# Patient Record
Sex: Male | Born: 1945 | Race: White | Hispanic: No | Smoking: Current every day smoker
Health system: Southern US, Community
[De-identification: ages and names within clinical notes are randomized; demographics above are authoritative.]

## PROBLEM LIST (undated history)

## (undated) HISTORY — PX: HIP SURGERY: SHX245

## (undated) HISTORY — PX: JOINT REPLACEMENT: SHX530

## (undated) HISTORY — PX: TONSILLECTOMY AND ADENOIDECTOMY: SHX28

## (undated) HISTORY — PX: HERNIA REPAIR: SHX51

## (undated) HISTORY — PX: CATARACT EXTRACTION, BILATERAL: SHX1313

---

## 2003-02-26 DIAGNOSIS — M7122 Synovial cyst of popliteal space [Baker], left knee: Secondary | ICD-10-CM | POA: Insufficient documentation

## 2004-10-12 ENCOUNTER — Emergency Department (HOSPITAL_COMMUNITY): Admission: EM | Admit: 2004-10-12 | Discharge: 2004-10-12 | Payer: Self-pay | Admitting: Emergency Medicine

## 2004-11-06 ENCOUNTER — Ambulatory Visit (HOSPITAL_COMMUNITY): Admission: RE | Admit: 2004-11-06 | Discharge: 2004-11-06 | Payer: Self-pay | Admitting: Neurosurgery

## 2005-06-24 ENCOUNTER — Ambulatory Visit (HOSPITAL_COMMUNITY): Admission: RE | Admit: 2005-06-24 | Discharge: 2005-06-24 | Payer: Self-pay | Admitting: Neurosurgery
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
## 2010-02-25 DIAGNOSIS — S43004S Unspecified dislocation of right shoulder joint, sequela: Secondary | ICD-10-CM | POA: Insufficient documentation

## 2010-03-18 ENCOUNTER — Encounter: Payer: Self-pay | Admitting: Neurosurgery

## 2011-02-26 DIAGNOSIS — S2239XA Fracture of one rib, unspecified side, initial encounter for closed fracture: Secondary | ICD-10-CM | POA: Insufficient documentation

## 2017-10-08 DIAGNOSIS — Z720 Tobacco use: Secondary | ICD-10-CM | POA: Insufficient documentation

## 2017-10-08 DIAGNOSIS — J410 Simple chronic bronchitis: Secondary | ICD-10-CM | POA: Insufficient documentation

## 2017-10-08 DIAGNOSIS — E559 Vitamin D deficiency, unspecified: Secondary | ICD-10-CM | POA: Insufficient documentation

## 2018-12-21 ENCOUNTER — Other Ambulatory Visit: Payer: Self-pay

## 2018-12-21 ENCOUNTER — Encounter: Payer: Self-pay | Admitting: Orthopedic Surgery

## 2018-12-21 ENCOUNTER — Ambulatory Visit (INDEPENDENT_AMBULATORY_CARE_PROVIDER_SITE_OTHER): Payer: Medicare Other | Admitting: Orthopedic Surgery

## 2018-12-21 VITALS — BP 138/82 | HR 63 | Ht 71.0 in | Wt 224.0 lb

## 2018-12-21 DIAGNOSIS — G8929 Other chronic pain: Secondary | ICD-10-CM

## 2018-12-21 DIAGNOSIS — M1711 Unilateral primary osteoarthritis, right knee: Secondary | ICD-10-CM

## 2018-12-21 DIAGNOSIS — G4733 Obstructive sleep apnea (adult) (pediatric): Secondary | ICD-10-CM | POA: Insufficient documentation

## 2018-12-21 DIAGNOSIS — M25561 Pain in right knee: Secondary | ICD-10-CM

## 2018-12-21 DIAGNOSIS — E785 Hyperlipidemia, unspecified: Secondary | ICD-10-CM | POA: Insufficient documentation

## 2018-12-21 DIAGNOSIS — I10 Essential (primary) hypertension: Secondary | ICD-10-CM | POA: Insufficient documentation

## 2018-12-21 DIAGNOSIS — K219 Gastro-esophageal reflux disease without esophagitis: Secondary | ICD-10-CM | POA: Insufficient documentation

## 2018-12-21 DIAGNOSIS — F411 Generalized anxiety disorder: Secondary | ICD-10-CM | POA: Insufficient documentation

## 2018-12-21 NOTE — Patient Instructions (Signed)

## 2018-12-21 NOTE — Progress Notes (Signed)
Chief Complaint  Patient presents with  . Knee Pain    right / has  been getting injections at [place]     Dustin Bryant is 73 years old he is a veteran he was getting injections in his right knee at the [place] and when Covid hit is injections were delayed  He has hypertension and diabetes anxiety arthritis bronchitis COPD hypertension GERD sleep apnea he is a smoker and has history of pneumonia  Has chronic right knee pain associated with occasional swelling decreased range of motion with moderate to severe pain  History reviewed. No pertinent past medical history.  Past Surgical History:  Procedure Laterality Date  . CATARACT EXTRACTION, BILATERAL    . HERNIA REPAIR    . HIP SURGERY    . JOINT REPLACEMENT     right hip replaced Jan 2019  . TONSILLECTOMY AND ADENOIDECTOMY     Family History  Problem Relation Age of Onset  . Cancer Mother   . Heart attack Father   . High blood pressure Father   . Cancer Sister        breast  . Diabetes Brother   . High blood pressure Brother     BP 138/82   Pulse 63   Ht 5\' 11"  (1.803 m)   Wt 224 lb (101.6 kg)   BMI 31.24 kg/m   His appearance was normal He was oriented x3 Mood pleasant and affect normal Gait and station showed a varus knee with mild limp favoring the right leg  His right knee was tender on the medial joint line again had some varus's range of motion was greater than 90 degrees knee was stable strength was normal skin was intact distal pulses were intact no edema lymph nodes negative sensation normal  X-ray report along with several other records from the [place] were reviewed  They indicate an osteoarthritic right knee  Patient wants an injection right knee that was granted  Procedure note right knee injection   verbal consent was obtained to inject right knee joint  Timeout was completed to confirm the site of injection  The medications used were 40 mg of Depo-Medrol and 1% lidocaine 3 cc  Anesthesia was  provided by ethyl chloride and the skin was prepped with alcohol.  After cleaning the skin with alcohol a 20-gauge needle was used to inject the right knee joint. There were no complications. A sterile bandage was applied.   Encounter Diagnoses  Name Primary?  . Chronic pain of right knee   . Primary osteoarthritis of right knee Yes    Follow-up as needed

## 2023-05-28 ENCOUNTER — Other Ambulatory Visit (HOSPITAL_COMMUNITY): Payer: Self-pay | Admitting: Physician Assistant

## 2023-05-28 DIAGNOSIS — Z122 Encounter for screening for malignant neoplasm of respiratory organs: Secondary | ICD-10-CM

## 2023-06-10 ENCOUNTER — Ambulatory Visit (HOSPITAL_COMMUNITY)
Admission: RE | Admit: 2023-06-10 | Discharge: 2023-06-10 | Disposition: A | Discharge status: Home / Self Care | Source: Ambulatory Visit | Attending: Physician Assistant | Admitting: Physician Assistant

## 2023-06-10 DIAGNOSIS — Z122 Encounter for screening for malignant neoplasm of respiratory organs: Secondary | ICD-10-CM | POA: Diagnosis present

## 2023-07-24 ENCOUNTER — Other Ambulatory Visit (HOSPITAL_COMMUNITY): Payer: Self-pay | Admitting: Chiropractic Medicine

## 2023-07-24 DIAGNOSIS — M5440 Lumbago with sciatica, unspecified side: Secondary | ICD-10-CM

## 2023-07-29 ENCOUNTER — Ambulatory Visit (HOSPITAL_COMMUNITY)
Admission: RE | Admit: 2023-07-29 | Discharge: 2023-07-29 | Disposition: A | Discharge status: Home / Self Care | Source: Ambulatory Visit | Attending: Chiropractic Medicine | Admitting: Chiropractic Medicine

## 2023-07-29 DIAGNOSIS — M5440 Lumbago with sciatica, unspecified side: Secondary | ICD-10-CM | POA: Diagnosis present

## 2024-02-24 ENCOUNTER — Ambulatory Visit (INDEPENDENT_AMBULATORY_CARE_PROVIDER_SITE_OTHER): Admitting: Orthopedic Surgery

## 2024-02-24 ENCOUNTER — Encounter: Payer: Self-pay | Admitting: Orthopedic Surgery

## 2024-02-24 DIAGNOSIS — S82831A Other fracture of upper and lower end of right fibula, initial encounter for closed fracture: Secondary | ICD-10-CM | POA: Diagnosis not present

## 2024-02-24 NOTE — Progress Notes (Signed)
 New Patient Visit  Summary: Dustin Bryant is a 78 y.o. male with the following: Right distal fibula fracture - [facility]  Assessment and Plan Assessment & Plan Right fibula fracture Non-displaced oblique fracture of the right fibula with intact ankle mortise and no ligamentous injury. Stable fracture not requiring surgery. Increased risk for displacement due to neuropathy. - Applied short leg cast for immobilization. - Instructed non-weight bearing to prevent displacement and surgery. - Advised limb elevation to reduce edema and pain. - Planned repeat radiograph in two weeks to assess healing and transition to walking boot. - Discussed monitoring for increased pain, swelling, or complications due to neuropathy.  Cast application - Right short leg cast   Verbal consent was obtained and the correct extremity was identified. A well padded, appropriately molded short leg cast was applied to the Right leg Fingers remained warm and well perfused.   There were no sharp edges Patient tolerated the procedure well Cast care instructions were provided     Follow-up: Return in about 2 weeks (around 03/09/2024).  Subjective:  Chief Complaint  Patient presents with   Fracture    R ankle DOI 02/14/24     Discussed the use of AI scribe software for clinical note transcription with the patient, who gave verbal consent to proceed.  History of Present Illness Dustin Bryant is a 78 year old male with type 2 diabetes and mild polyneuropathy who presents for evaluation of a right fibular fracture.  On December 20th he fell in his driveway and injured his right ankle. Urgent care radiographs showed an isolated right fibular fracture, and a splint was applied on December 22nd.  He has minimal pain at the fracture site with intermittent quivering and tingling over the dorsum of the foot and fracture area. He notes bruising at the top of the splint that he attributes to splint pressure, and  increased swelling when the leg is not elevated. He has remained non-weight-bearing since splint placement but did walk on the ankle for several days before immobilization. He finds the splint uncomfortable.  Acetaminophen provides adequate pain control. He avoids other analgesics due to reflux and takes daily low-dose aspirin.  He has mild neuropathy involving the dorsum of the foot that may blunt pain perception. He has some difficulty keeping the ankle elevated due to wheelchair limitations but can elevate it in a recliner at home.    Review of Systems: No fevers or chills No numbness or tingling No chest pain No shortness of breath No bowel or bladder dysfunction No GI distress No headaches   Medical History:  Hypertension OSA on CPAP Simple chronic bronchitis GERD History of compression fracture of thoracic vertebra Rib fractures Right shoulder dislocation Generalized anxiety disorder Hyperlipidemia Tobacco abuse  Vitamin D deficiency  Past Surgical History:  Procedure Laterality Date   CATARACT EXTRACTION, BILATERAL     HERNIA REPAIR     HIP SURGERY     JOINT REPLACEMENT     right hip replaced Jan 2019   TONSILLECTOMY AND ADENOIDECTOMY      Family History  Problem Relation Age of Onset   Cancer Mother    Heart attack Father    High blood pressure Father    Cancer Sister        breast   Diabetes Brother    High blood pressure Brother    Social History[1]  Allergies[2]  Active Medications[3]  Objective: There were no vitals taken for this visit.  Physical Exam:  General: Elderly male., Alert and oriented., No acute distress., and Seated in a wheelchair. Gait: Unable to ambulate.  Physical Exam MUSCULOSKELETAL: No tenderness on the medial aspect of the ankle.  Diffuse bruising and swelling about the ankle, extending to the dorsum of the foot.  Toes with some swelling and bruising.  Decreased sensation to the dorsum of the foot.  2+ DP pulse.   No pain or tenderness proximally.   IMAGING: I personally reviewed images previously obtained from the ED          New Medications:  No orders of the defined types were placed in this encounter.     Portions of this note were completed via Scientist, clinical (histocompatibility and immunogenetics).  Oneil DELENA Horde, MD  02/24/2024 11:43 AM      [1]  Social History Tobacco Use   Smoking status: Every Day   Smokeless tobacco: Never  [2]  Allergies Allergen Reactions   Nephramine   [3]  Current Meds  Medication Sig   albuterol (VENTOLIN HFA) 108 (90 Base) MCG/ACT inhaler Inhale into the lungs.   amLODipine (NORVASC) 5 MG tablet    Ascorbic Acid (VITAMIN C) 100 MG CHEW Chew by mouth.   aspirin EC 81 MG tablet Take by mouth.   atorvastatin (LIPITOR) 80 MG tablet Take by mouth.   benazepril (LOTENSIN) 20 MG tablet    budesonide-formoterol (SYMBICORT) 160-4.5 MCG/ACT inhaler Inhale into the lungs.   buPROPion (ZYBAN) 150 MG 12 hr tablet Take by mouth.   carvedilol (COREG) 25 MG tablet Take by mouth.   ferrous sulfate 325 (65 FE) MG tablet Take by mouth.   FLUoxetine (PROZAC) 20 MG capsule Take by mouth.   hydrochlorothiazide (HYDRODIURIL) 25 MG tablet Take by mouth.   metFORMIN (GLUCOPHAGE-XR) 500 MG 24 hr tablet Take by mouth.   nicotine (NICODERM CQ - DOSED IN MG/24 HOURS) 21 mg/24hr patch Place onto the skin.   omeprazole (PRILOSEC) 20 MG capsule Take by mouth.   sildenafil (REVATIO) 20 MG tablet 1-2 tabs po daily, 30 minutes before activity   tiotropium (SPIRIVA) 18 MCG inhalation capsule Place into inhaler and inhale.   vitamin B-12 (CYANOCOBALAMIN) 1000 MCG tablet Take by mouth.

## 2024-02-24 NOTE — Patient Instructions (Signed)

## 2024-03-09 ENCOUNTER — Other Ambulatory Visit (INDEPENDENT_AMBULATORY_CARE_PROVIDER_SITE_OTHER): Payer: Self-pay

## 2024-03-09 ENCOUNTER — Encounter: Admitting: Orthopedic Surgery

## 2024-03-09 ENCOUNTER — Encounter: Payer: Self-pay | Admitting: Orthopedic Surgery

## 2024-03-09 ENCOUNTER — Ambulatory Visit (INDEPENDENT_AMBULATORY_CARE_PROVIDER_SITE_OTHER): Admitting: Orthopedic Surgery

## 2024-03-09 DIAGNOSIS — S82831D Other fracture of upper and lower end of right fibula, subsequent encounter for closed fracture with routine healing: Secondary | ICD-10-CM

## 2024-03-09 DIAGNOSIS — S82831A Other fracture of upper and lower end of right fibula, initial encounter for closed fracture: Secondary | ICD-10-CM

## 2024-03-09 NOTE — Patient Instructions (Signed)
Instructions ° °1.  You have sustained an ankle sprain, or similar exercises that can be treated as an ankle sprain.  **These exercises can also be used as part of recovery from an ankle fracture.  °2.  I encourage you to stay on your feet and gradually remove your walking boot.   °3.  Below are some exercises that you can complete on your own to improve your symptoms.  °4.  As an alternative, you can search for ankle sprain exercises online, and can see some demonstrations on YouTube  °5.  If you are having difficulty with these exercises, we can also prescribe formal physical therapy ° °Ankle Exercises °Ask your health care provider which exercises are safe for you. Do exercises exactly as told by your health care provider and adjust them as directed. It is normal to feel mild stretching, pulling, tightness, or mild discomfort as you do these exercises. Stop right away if you feel sudden pain or your pain gets worse. Do not begin these exercises until told by your health care provider. ° °Stretching and range-of-motion exercises °These exercises warm up your muscles and joints and improve the movement and flexibility of your ankle. These exercises may also help to relieve pain. ° °Dorsiflexion/plantar flexion ° °Sit with your R knee straight or bent. Do not rest your foot on anything. °Flex your left ankle to tilt the top of your foot toward your shin. This is called dorsiflexion. °Hold this position for 5 seconds. °Point your toes downward to tilt the top of your foot away from your shin. This is called plantar flexion. °Hold this position for 5 seconds. °Repeat 10 times. Complete this exercise 2-3 times a day.  As tolerated ° °Ankle alphabet ° °Sit with your R foot supported at your lower leg. °Do not rest your foot on anything. °Make sure your foot has room to move freely. °Think of your R foot as a paintbrush: °Move your foot to trace each letter of the alphabet in the air. Keep your hip and knee still while  you trace the letters. Trace every letter from A to Z. °Make the letters as large as you can without causing or increasing any discomfort. ° °Repeat 2-3 times. Complete this exercise 2-3 times a day. ° ° °Strengthening exercises °These exercises build strength and endurance in your ankle. Endurance is the ability to use your muscles for a long time, even after they get tired. °Dorsiflexors °These are muscles that lift your foot up. °Secure a rubber exercise band or tube to an object, such as a table leg, that will stay still when the band is pulled. Secure the other end around your R foot. °Sit on the floor, facing the object with your R leg extended. The band or tube should be slightly tense when your foot is relaxed. °Slowly flex your R ankle and toes to bring your foot toward your shin. °Hold this position for 5 seconds. °Slowly return your foot to the starting position, controlling the band as you do that. °Repeat 10 times. Complete this exercise 2-3 times a day. ° °Plantar flexors °These are muscles that push your foot down. °Sit on the floor with your R leg extended. °Loop a rubber exercise band or tube around the ball of your R foot. The ball of your foot is on the walking surface, right under your toes. The band or tube should be slightly tense when your foot is relaxed. °Slowly point your toes downward, pushing them away from   you. °Hold this position for 5 seconds. °Slowly release the tension in the band or tube, controlling smoothly until your foot is back in the starting position. °Repeat 10 times. Complete this exercise 2-3 times a day. ° °Towel curls ° °Sit in a chair on a non-carpeted surface, and put your feet on the floor. °Place a towel in front of your feet. °Keeping your heel on the floor, put your R foot on the towel. °Pull the towel toward you by grabbing the towel with your toes and curling them under. Keep your heel on the floor. °Let your toes relax. °Grab the towel again. Keep pulling the  towel until it is completely underneath your foot. °Repeat 10 times. Complete this exercise 2-3 times a day. ° °Standing plantar flexion °This is an exercise in which you use your toes to lift your body's weight while standing. °Stand with your feet shoulder-width apart. °Keep your weight spread evenly over the width of your feet while you rise up on your toes. Use a wall or table to steady yourself if needed, but try not to use it for support. °If this exercise is too easy, try these options: °Shift your weight toward your R leg until you feel challenged. °If told by your health care provider, lift your uninjured leg off the floor. °Hold this position for 5 seconds. °Repeat 10 times. Complete this exercise 2-3 times a day. ° °Tandem walking °Stand with one foot directly in front of the other. °Slowly raise your back foot up, lifting your heel before your toes, and place it directly in front of your other foot. °Continue to walk in this heel-to-toe way. Have a countertop or wall nearby to use if needed to keep your balance, but try not to hold onto anything for support. ° °Repeat 10 times. Complete this exercise 2-3 times a day. ° ° °Document Revised: 11/08/2017 Document Reviewed: 11/10/2017 °Elsevier Patient Education © 2020 [employer] ° °

## 2024-03-09 NOTE — Progress Notes (Signed)
 Return patient Visit  Summary: Dustin Bryant is a 79 y.o. male with the following: Right distal fibula fracture - [facility]  Assessment and Plan Assessment & Plan Right fibula fracture -reviewed radiographs demonstrates maintenance of alignment.  There is been no further displacement.  Swelling is much better.  Minimal tenderness to palpation on physical exam.  We reviewed radiographs together.  All questions have been answered.  Transition to a walking boot.  Active weightbearing as acceptable.  Elevate the foot to help with swelling.  I provided ankle exercises to initiate range of motion.  He states understanding.  Follow-up in 3 weeks.       Follow-up: Return in about 3 weeks (around 03/30/2024).  Subjective:  Chief Complaint  Patient presents with   Fracture    History of Present Illness Dustin Bryant is a 79 year old male with type 2 diabetes and mild polyneuropathy who returns for evaluation of a right fibular fracture.  He injured his right ankle, approximately 3 weeks ago.  He has been in a cast.  He has tolerated this cast well.  He has remained nonweightbearing.  He takes Tylenol occasionally.    Review of Systems: No fevers or chills No numbness or tingling No chest pain No shortness of breath No bowel or bladder dysfunction No GI distress No headaches   Objective: There were no vitals taken for this visit.  Physical Exam:    General: Elderly male., Alert and oriented., No acute distress., and Seated in a wheelchair. Gait: Unable to ambulate.  Physical Exam MUSCULOSKELETAL: Right ankle with improved swelling.  Some residual ecchymosis in the dependent portion of the hindfoot.  Ecchymosis around the toes.  No point tenderness.  Mild tenderness to palpation over the fracture of the lateral ankle.  No tenderness medial.  He tolerates gentle range of motion.  No skin breakdown.  There is some bruising proximally from the cast.  Toes are warm and  well-perfused.   IMAGING: I personally ordered and reviewed the following images   X-rays of the right ankle were obtained in clinic today.  These are nonweightbearing views.  These are compared to prior x-rays.  There has been no interval displacement.  No obvious callus formation of the distal fibula fracture.  The mortise is congruent.  There is no disruption of the syndesmosis.  Impression: Stable right oblique distal fibula fracture       New Medications:  No orders of the defined types were placed in this encounter.     Portions of this note were completed via Scientist, clinical (histocompatibility and immunogenetics).  Oneil DELENA Horde, MD  03/09/2024 10:45 AM

## 2024-03-11 ENCOUNTER — Telehealth: Payer: Self-pay | Admitting: Orthopedic Surgery

## 2024-03-11 NOTE — Telephone Encounter (Signed)
 Dr. Onesimo pt - pt lvm stating that he wants someone to call him and give him so information on the boot he has.  [phone]

## 2024-03-29 ENCOUNTER — Telehealth: Payer: Self-pay | Admitting: Orthopedic Surgery

## 2024-03-29 NOTE — Telephone Encounter (Signed)
 Dr. Onesimo pt - spoke w/the pt earlier today, he requested to rs his appt for 2/03 due to the weather.  He rs for 04/14/24.  He later cb an lvm wanting to know if he needs to continue wearing the boot until his next appt, 04/14/24.  [phone]

## 2024-03-30 ENCOUNTER — Ambulatory Visit: Admitting: Orthopedic Surgery

## 2024-04-14 ENCOUNTER — Ambulatory Visit: Admitting: Orthopedic Surgery
